# Patient Record
Sex: Female | Born: 1940 | Race: White | Hispanic: No | Smoking: Never smoker
Health system: Southern US, Community
[De-identification: ages and names within clinical notes are randomized; demographics above are authoritative.]

## PROBLEM LIST (undated history)

## (undated) DIAGNOSIS — E119 Type 2 diabetes mellitus without complications: Secondary | ICD-10-CM

## (undated) DIAGNOSIS — I10 Essential (primary) hypertension: Secondary | ICD-10-CM

## (undated) HISTORY — DX: Essential (primary) hypertension: I10

## (undated) HISTORY — DX: Type 2 diabetes mellitus without complications: E11.9

---

## 1997-09-23 ENCOUNTER — Ambulatory Visit (HOSPITAL_COMMUNITY): Admission: RE | Admit: 1997-09-23 | Discharge: 1997-09-23 | Payer: Self-pay | Admitting: Gynecology

## 1999-03-06 ENCOUNTER — Encounter: Payer: Self-pay | Admitting: Gynecology

## 1999-03-06 ENCOUNTER — Ambulatory Visit (HOSPITAL_COMMUNITY): Admission: RE | Admit: 1999-03-06 | Discharge: 1999-03-06 | Payer: Self-pay | Admitting: Gynecology

## 2000-04-19 ENCOUNTER — Encounter: Payer: Self-pay | Admitting: Gynecology

## 2000-04-19 ENCOUNTER — Ambulatory Visit (HOSPITAL_COMMUNITY): Admission: RE | Admit: 2000-04-19 | Discharge: 2000-04-19 | Payer: Self-pay | Admitting: Gynecology

## 2001-05-15 ENCOUNTER — Encounter: Payer: Self-pay | Admitting: Gynecology

## 2001-05-15 ENCOUNTER — Ambulatory Visit (HOSPITAL_COMMUNITY): Admission: RE | Admit: 2001-05-15 | Discharge: 2001-05-15 | Payer: Self-pay | Admitting: Gynecology

## 2002-06-08 ENCOUNTER — Encounter: Payer: Self-pay | Admitting: Gynecology

## 2002-06-08 ENCOUNTER — Ambulatory Visit (HOSPITAL_COMMUNITY): Admission: RE | Admit: 2002-06-08 | Discharge: 2002-06-08 | Payer: Self-pay | Admitting: Gynecology

## 2003-07-03 ENCOUNTER — Ambulatory Visit (HOSPITAL_COMMUNITY): Admission: RE | Admit: 2003-07-03 | Discharge: 2003-07-03 | Payer: Self-pay | Admitting: Gynecology

## 2004-09-21 ENCOUNTER — Ambulatory Visit (HOSPITAL_COMMUNITY): Admission: RE | Admit: 2004-09-21 | Discharge: 2004-09-21 | Payer: Self-pay | Admitting: Obstetrics and Gynecology

## 2005-11-04 ENCOUNTER — Ambulatory Visit (HOSPITAL_COMMUNITY): Admission: RE | Admit: 2005-11-04 | Discharge: 2005-11-04 | Payer: Self-pay | Admitting: Obstetrics and Gynecology

## 2006-12-21 ENCOUNTER — Ambulatory Visit (HOSPITAL_COMMUNITY): Admission: RE | Admit: 2006-12-21 | Discharge: 2006-12-21 | Payer: Self-pay | Admitting: Obstetrics and Gynecology

## 2013-07-16 ENCOUNTER — Ambulatory Visit (INDEPENDENT_AMBULATORY_CARE_PROVIDER_SITE_OTHER): Payer: Medicare Other | Admitting: Family Medicine

## 2013-07-16 ENCOUNTER — Ambulatory Visit (INDEPENDENT_AMBULATORY_CARE_PROVIDER_SITE_OTHER): Payer: Medicare Other

## 2013-07-16 VITALS — BP 142/66 | HR 95 | Temp 97.6°F | Resp 20 | Ht 64.5 in | Wt 160.4 lb

## 2013-07-16 DIAGNOSIS — R05 Cough: Secondary | ICD-10-CM

## 2013-07-16 DIAGNOSIS — E119 Type 2 diabetes mellitus without complications: Secondary | ICD-10-CM

## 2013-07-16 DIAGNOSIS — I10 Essential (primary) hypertension: Secondary | ICD-10-CM

## 2013-07-16 DIAGNOSIS — R059 Cough, unspecified: Secondary | ICD-10-CM

## 2013-07-16 DIAGNOSIS — L259 Unspecified contact dermatitis, unspecified cause: Secondary | ICD-10-CM

## 2013-07-16 DIAGNOSIS — R053 Chronic cough: Secondary | ICD-10-CM

## 2013-07-16 MED ORDER — TRIAMCINOLONE ACETONIDE 0.1 % EX CREA: 1.0000 "application " | TOPICAL_CREAM | Freq: Two times a day (BID) | CUTANEOUS | Status: AC

## 2013-07-16 MED ORDER — BECLOMETHASONE DIPROPIONATE 40 MCG/ACT IN AERS: 2.0000 | INHALATION_SPRAY | Freq: Two times a day (BID) | RESPIRATORY_TRACT | Status: AC

## 2013-07-16 NOTE — Patient Instructions (Addendum)
Use triamcinolone cream twice daily on rash  Takes Zyrtec for itching  Use qvar twice daily  Use callous/corn pad  Return if worse

## 2013-07-16 NOTE — Progress Notes (Signed)
Subjective: 73 year old lady who is here with several things. She has a rash on her neck that has been out of the last week. It's itching. She had been working in her yard. It did not look like poison ivy. She tried OTC medications without relief.  She's been having a cough for the last year, low-grade chronic cough usually calmed down with cough drops. When she was taken off lisinopril she did better, but it is persisted.   She also has a callus on her right foot she wants it checked.   Objective: Splotchy rash on the left back of her neck and on the right side of the upper shoulder. Both look like a contact dermatitis but that is no linear excoriation.  Chest clear. Heart regular without murmurs. Callus on right foot between the fourth and fifth metatarsal heads.   Assessment: Chronic cough Contact dermatitis Callus/wart on foot  Plan: Chest x-ray [facility] reading (PRIMARY) by  Dr. Alwyn RenHopper Coin lesion left lung mid chest.  Repeat film shows this corresponds to the mole on her back.  I offered to pare the callous off but she declined.  Use triamcinolone cream  Triamcinolone . Qvar for chest

## 2014-06-12 ENCOUNTER — Other Ambulatory Visit (HOSPITAL_COMMUNITY): Payer: Self-pay | Admitting: Obstetrics and Gynecology

## 2014-06-12 DIAGNOSIS — Z1231 Encounter for screening mammogram for malignant neoplasm of breast: Secondary | ICD-10-CM

## 2014-06-17 ENCOUNTER — Ambulatory Visit (HOSPITAL_COMMUNITY)
Admission: RE | Admit: 2014-06-17 | Discharge: 2014-06-17 | Disposition: A | Discharge status: Home / Self Care | Payer: Medicare Other | Source: Ambulatory Visit | Attending: Obstetrics and Gynecology | Admitting: Obstetrics and Gynecology

## 2014-06-17 DIAGNOSIS — Z1231 Encounter for screening mammogram for malignant neoplasm of breast: Secondary | ICD-10-CM | POA: Insufficient documentation

## 2015-07-03 ENCOUNTER — Other Ambulatory Visit: Payer: Self-pay | Admitting: Obstetrics and Gynecology

## 2015-07-03 DIAGNOSIS — Z1231 Encounter for screening mammogram for malignant neoplasm of breast: Secondary | ICD-10-CM

## 2015-07-15 ENCOUNTER — Ambulatory Visit
Admission: RE | Admit: 2015-07-15 | Discharge: 2015-07-15 | Disposition: A | Discharge status: Home / Self Care | Payer: Medicare Other | Source: Ambulatory Visit | Attending: Obstetrics and Gynecology | Admitting: Obstetrics and Gynecology

## 2015-07-15 DIAGNOSIS — Z1231 Encounter for screening mammogram for malignant neoplasm of breast: Secondary | ICD-10-CM

## 2016-08-05 ENCOUNTER — Ambulatory Visit
Admission: RE | Admit: 2016-08-05 | Discharge: 2016-08-05 | Disposition: A | Discharge status: Home / Self Care | Payer: Medicare Other | Source: Ambulatory Visit | Attending: Obstetrics and Gynecology | Admitting: Obstetrics and Gynecology

## 2016-08-05 ENCOUNTER — Other Ambulatory Visit: Payer: Self-pay | Admitting: Obstetrics and Gynecology

## 2016-08-05 DIAGNOSIS — Z0000 Encounter for general adult medical examination without abnormal findings: Secondary | ICD-10-CM

## 2017-08-10 ENCOUNTER — Other Ambulatory Visit: Payer: Self-pay | Admitting: Obstetrics and Gynecology

## 2017-08-10 DIAGNOSIS — Z1231 Encounter for screening mammogram for malignant neoplasm of breast: Secondary | ICD-10-CM

## 2017-09-20 ENCOUNTER — Ambulatory Visit
Admission: RE | Admit: 2017-09-20 | Discharge: 2017-09-20 | Disposition: A | Discharge status: Home / Self Care | Payer: Medicare Other | Source: Ambulatory Visit | Attending: Obstetrics and Gynecology | Admitting: Obstetrics and Gynecology

## 2017-09-20 DIAGNOSIS — Z1231 Encounter for screening mammogram for malignant neoplasm of breast: Secondary | ICD-10-CM

## 2018-09-18 ENCOUNTER — Other Ambulatory Visit: Payer: Self-pay | Admitting: Obstetrics and Gynecology

## 2018-09-18 DIAGNOSIS — Z1231 Encounter for screening mammogram for malignant neoplasm of breast: Secondary | ICD-10-CM

## 2018-12-05 ENCOUNTER — Ambulatory Visit: Payer: Medicare Other

## 2018-12-05 ENCOUNTER — Other Ambulatory Visit: Payer: Self-pay

## 2018-12-05 ENCOUNTER — Ambulatory Visit
Admission: RE | Admit: 2018-12-05 | Discharge: 2018-12-05 | Disposition: A | Discharge status: Home / Self Care | Payer: Medicare Other | Source: Ambulatory Visit | Attending: Obstetrics and Gynecology | Admitting: Obstetrics and Gynecology

## 2018-12-05 DIAGNOSIS — Z1231 Encounter for screening mammogram for malignant neoplasm of breast: Secondary | ICD-10-CM

## 2019-01-12 ENCOUNTER — Ambulatory Visit: Payer: Medicare Other

## 2020-01-29 ENCOUNTER — Other Ambulatory Visit: Payer: Self-pay | Admitting: Obstetrics and Gynecology

## 2020-01-29 DIAGNOSIS — Z1231 Encounter for screening mammogram for malignant neoplasm of breast: Secondary | ICD-10-CM

## 2020-01-30 ENCOUNTER — Ambulatory Visit
Admission: RE | Admit: 2020-01-30 | Discharge: 2020-01-30 | Disposition: A | Discharge status: Home / Self Care | Payer: Medicare Other | Source: Ambulatory Visit | Attending: Obstetrics and Gynecology | Admitting: Obstetrics and Gynecology

## 2020-01-30 ENCOUNTER — Other Ambulatory Visit: Payer: Self-pay

## 2020-01-30 ENCOUNTER — Other Ambulatory Visit: Payer: Self-pay | Admitting: Obstetrics and Gynecology

## 2020-01-30 DIAGNOSIS — Z1231 Encounter for screening mammogram for malignant neoplasm of breast: Secondary | ICD-10-CM

## 2020-01-30 DIAGNOSIS — M810 Age-related osteoporosis without current pathological fracture: Secondary | ICD-10-CM

## 2020-05-15 ENCOUNTER — Other Ambulatory Visit: Payer: Medicare Other

## 2020-12-15 ENCOUNTER — Other Ambulatory Visit: Payer: Medicare Other

## 2021-03-10 ENCOUNTER — Other Ambulatory Visit: Payer: Self-pay | Admitting: Obstetrics and Gynecology

## 2021-03-10 DIAGNOSIS — Z1231 Encounter for screening mammogram for malignant neoplasm of breast: Secondary | ICD-10-CM

## 2021-03-13 ENCOUNTER — Ambulatory Visit
Admission: RE | Admit: 2021-03-13 | Discharge: 2021-03-13 | Disposition: A | Discharge status: Home / Self Care | Payer: Medicare Other | Source: Ambulatory Visit | Attending: Obstetrics and Gynecology | Admitting: Obstetrics and Gynecology

## 2021-03-13 DIAGNOSIS — Z1231 Encounter for screening mammogram for malignant neoplasm of breast: Secondary | ICD-10-CM

## 2022-04-05 ENCOUNTER — Other Ambulatory Visit: Payer: Self-pay | Admitting: Family Medicine

## 2022-04-05 ENCOUNTER — Encounter: Payer: Self-pay | Admitting: Obstetrics and Gynecology

## 2022-04-05 DIAGNOSIS — Z1231 Encounter for screening mammogram for malignant neoplasm of breast: Secondary | ICD-10-CM

## 2022-04-07 ENCOUNTER — Ambulatory Visit
Admission: RE | Admit: 2022-04-07 | Discharge: 2022-04-07 | Disposition: A | Discharge status: Home / Self Care | Payer: Medicare Other | Source: Ambulatory Visit | Attending: Family Medicine | Admitting: Family Medicine

## 2022-04-07 DIAGNOSIS — Z1231 Encounter for screening mammogram for malignant neoplasm of breast: Secondary | ICD-10-CM

## 2023-04-29 ENCOUNTER — Other Ambulatory Visit: Payer: Self-pay | Admitting: Family Medicine

## 2023-04-29 DIAGNOSIS — Z0000 Encounter for general adult medical examination without abnormal findings: Secondary | ICD-10-CM

## 2023-05-12 ENCOUNTER — Ambulatory Visit

## 2023-05-16 ENCOUNTER — Ambulatory Visit
Admission: RE | Admit: 2023-05-16 | Discharge: 2023-05-16 | Disposition: A | Discharge status: Home / Self Care | Source: Ambulatory Visit | Attending: Family Medicine | Admitting: Family Medicine

## 2023-05-16 DIAGNOSIS — Z0000 Encounter for general adult medical examination without abnormal findings: Secondary | ICD-10-CM

## 2023-10-05 IMAGING — MG MM DIGITAL SCREENING BILAT W/ TOMO AND CAD
8 series · 8 of 24 positions shown · non-contrast
Comparison: Previous exam(s).

CLINICAL DATA: Screening.

EXAM:
DIGITAL SCREENING BILATERAL MAMMOGRAM WITH TOMOSYNTHESIS AND CAD
TECHNIQUE: Bilateral screening digital craniocaudal and mediolateral oblique
mammograms were obtained. Bilateral screening digital breast
tomosynthesis was performed. The images were evaluated with
computer-aided detection.

[L CC synth-2D]
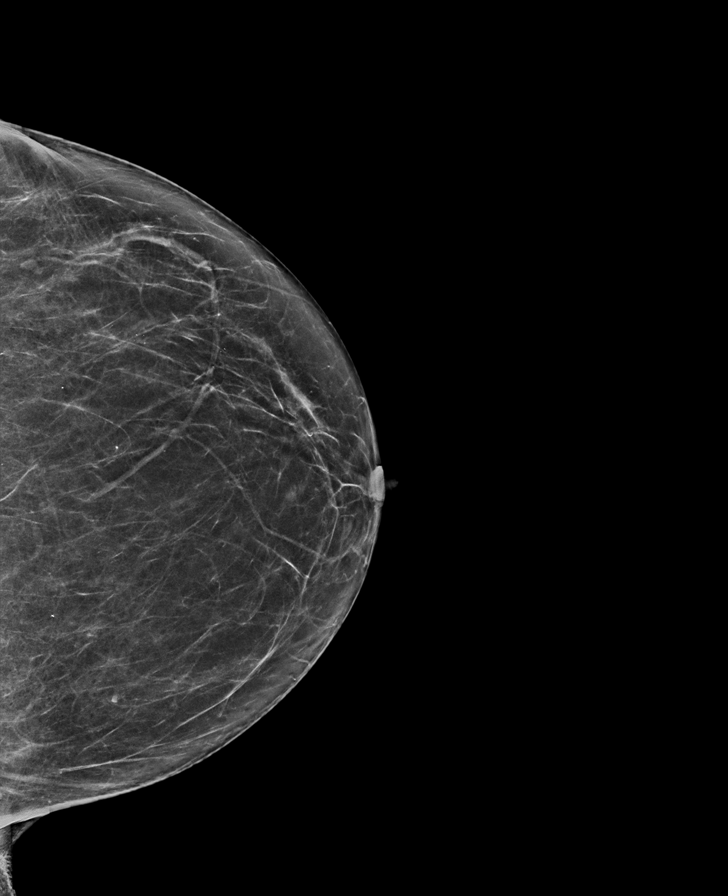

[R MLO synth-2D]
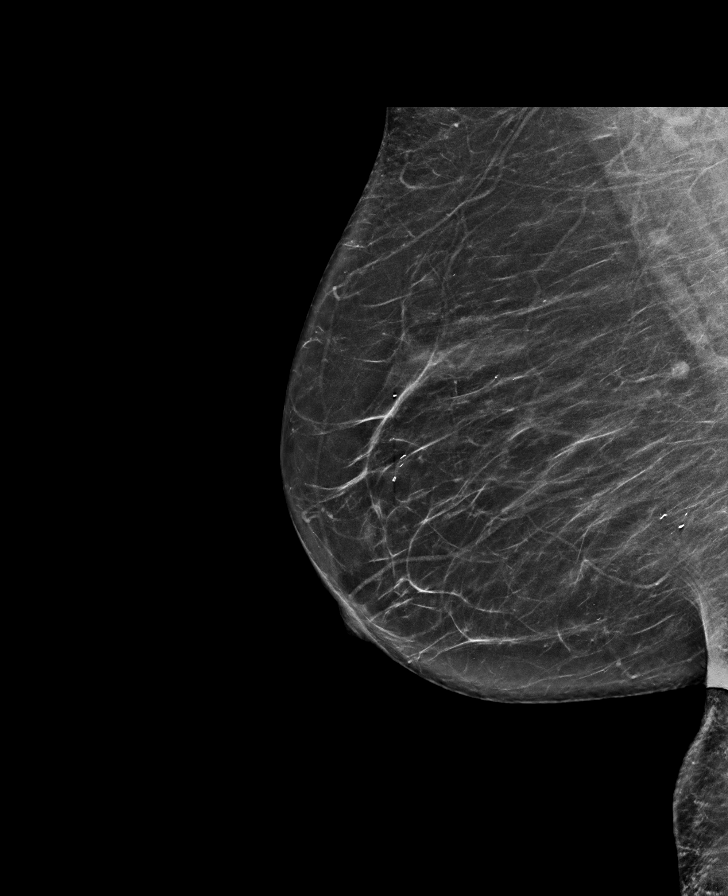

[L MLO synth-2D]
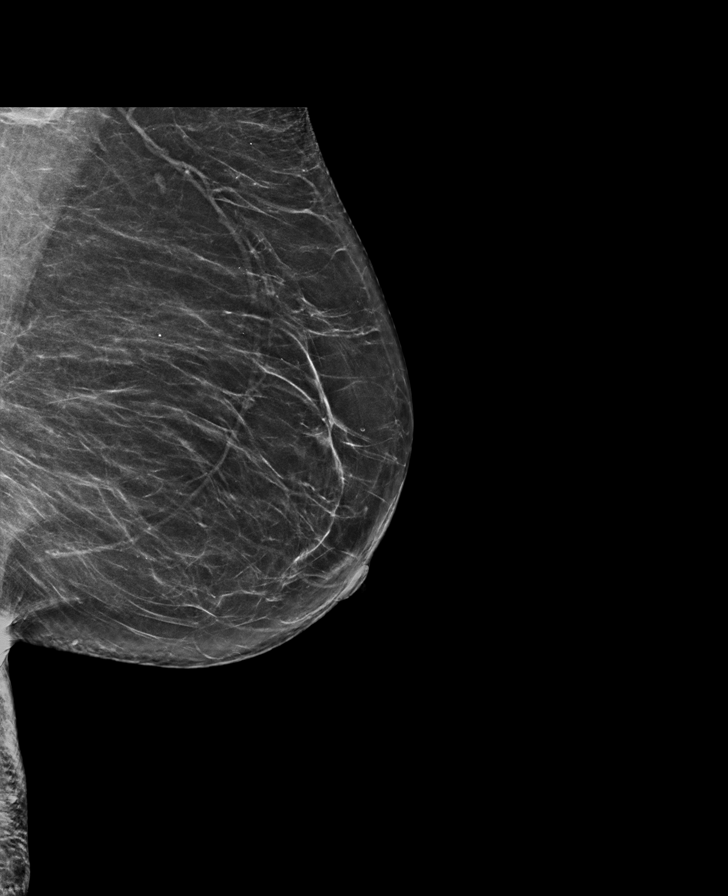

[R CC synth-2D]
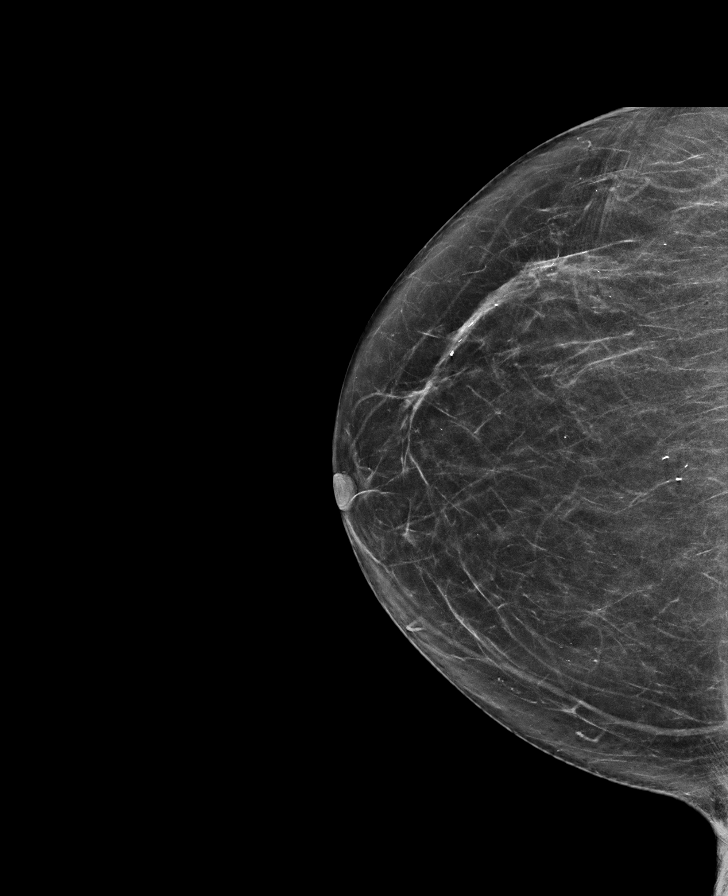

[L CC tomo · tomo slice 33/65.0]
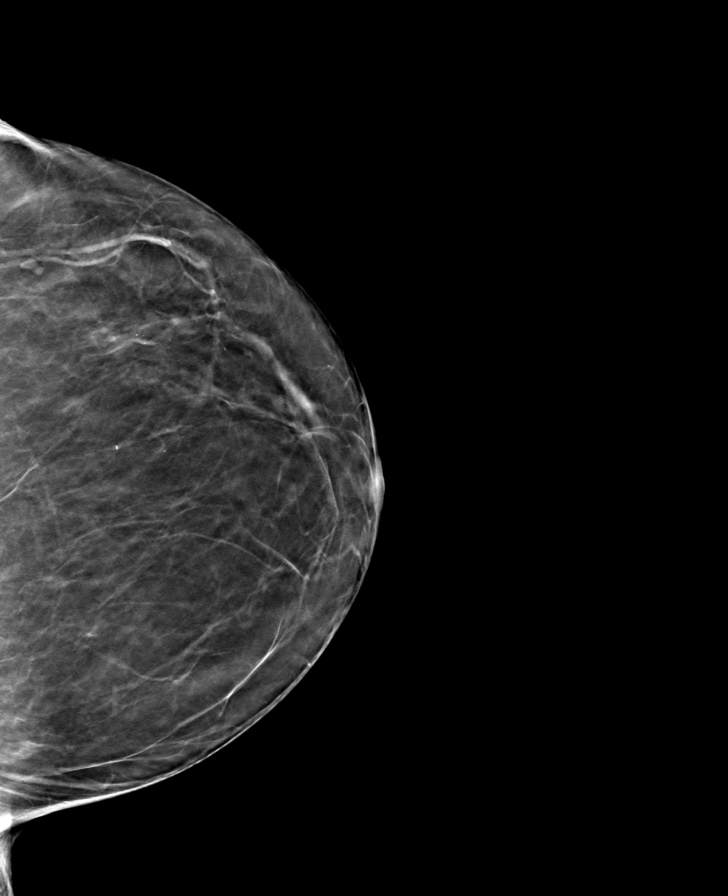

[L MLO tomo · tomo slice 37/72.0]
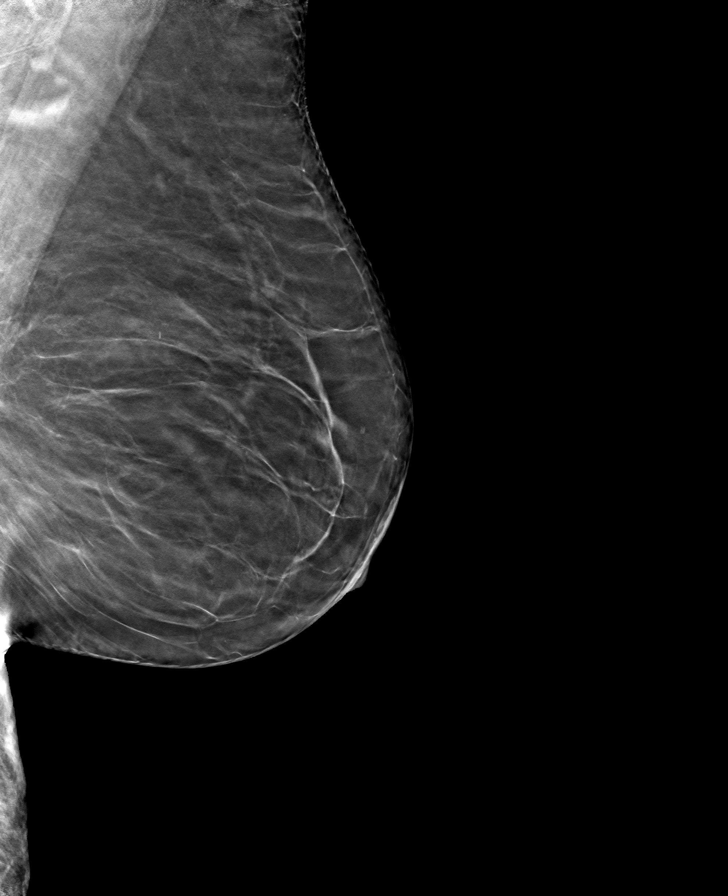

[R CC tomo · tomo slice 35/69.0]
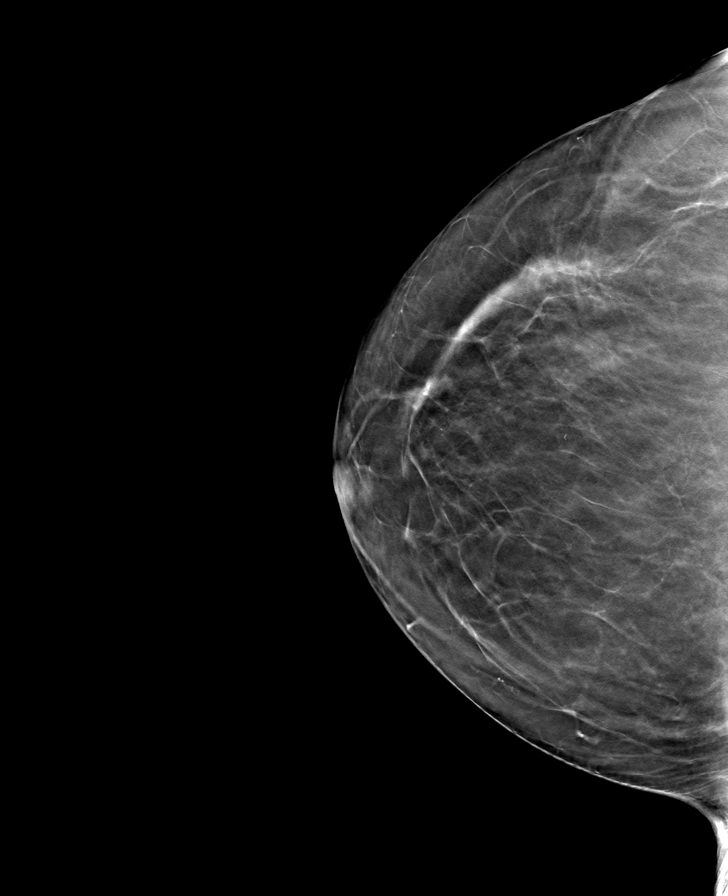

[R MLO tomo · tomo slice 36/71.0]
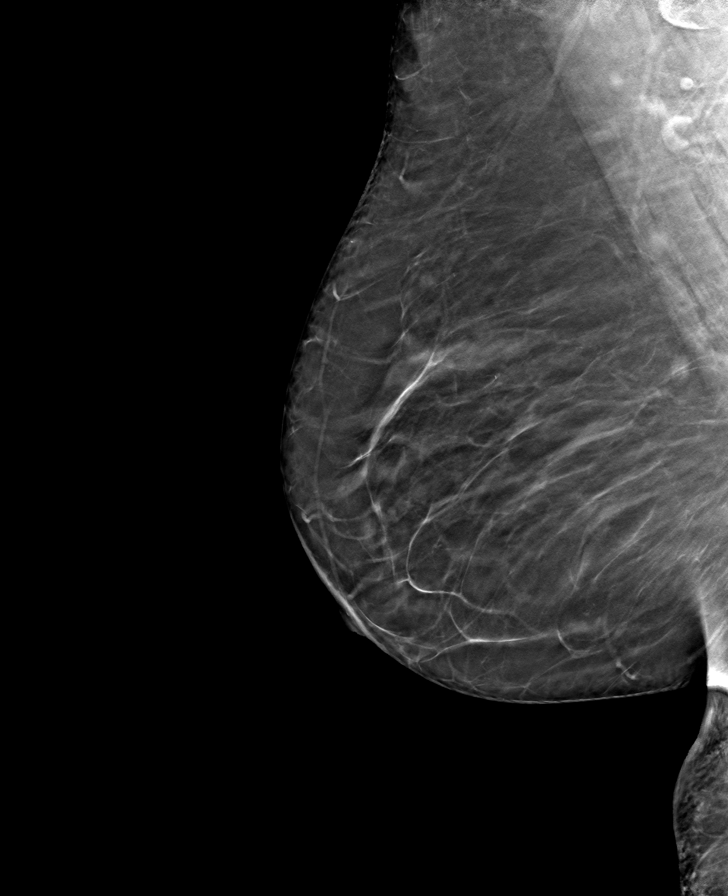

[8 of 24 positions shown; findings below may reference images not displayed]

ACR Breast Density Category b: There are scattered areas of
fibroglandular density.
FINDINGS: There are no findings suspicious for malignancy.
IMPRESSION: No mammographic evidence of malignancy. A result letter of this
screening mammogram will be mailed directly to the patient.

RECOMMENDATION:
Screening mammogram in one year. (Code:51-O-LD2)

BI-RADS CATEGORY  1: Negative.
